# Patient Record
Sex: Female | Born: 1985 | Race: White | Hispanic: No | Marital: Married | Smoking: Current every day smoker
Health system: Southern US, Community
[De-identification: ages and names within clinical notes are randomized; demographics above are authoritative.]

## PROBLEM LIST (undated history)

## (undated) DIAGNOSIS — M5030 Other cervical disc degeneration, unspecified cervical region: Secondary | ICD-10-CM

## (undated) HISTORY — PX: CHOLECYSTECTOMY: SHX55

---

## 2018-07-21 ENCOUNTER — Emergency Department (HOSPITAL_COMMUNITY): Payer: Self-pay

## 2018-07-21 ENCOUNTER — Encounter (HOSPITAL_COMMUNITY): Payer: Self-pay | Admitting: Emergency Medicine

## 2018-07-21 ENCOUNTER — Emergency Department (HOSPITAL_COMMUNITY)
Admission: EM | Admit: 2018-07-21 | Discharge: 2018-07-21 | Disposition: A | Discharge status: Home / Self Care | Payer: Self-pay | Attending: Emergency Medicine | Admitting: Emergency Medicine

## 2018-07-21 ENCOUNTER — Other Ambulatory Visit: Payer: Self-pay

## 2018-07-21 DIAGNOSIS — S31119A Laceration without foreign body of abdominal wall, unspecified quadrant without penetration into peritoneal cavity, initial encounter: Secondary | ICD-10-CM

## 2018-07-21 DIAGNOSIS — S21219A Laceration without foreign body of unspecified back wall of thorax without penetration into thoracic cavity, initial encounter: Secondary | ICD-10-CM | POA: Insufficient documentation

## 2018-07-21 DIAGNOSIS — Y939 Activity, unspecified: Secondary | ICD-10-CM | POA: Insufficient documentation

## 2018-07-21 DIAGNOSIS — Z23 Encounter for immunization: Secondary | ICD-10-CM | POA: Insufficient documentation

## 2018-07-21 DIAGNOSIS — F17210 Nicotine dependence, cigarettes, uncomplicated: Secondary | ICD-10-CM | POA: Insufficient documentation

## 2018-07-21 DIAGNOSIS — S0922XA Traumatic rupture of left ear drum, initial encounter: Secondary | ICD-10-CM | POA: Insufficient documentation

## 2018-07-21 DIAGNOSIS — S0012XA Contusion of left eyelid and periocular area, initial encounter: Secondary | ICD-10-CM | POA: Insufficient documentation

## 2018-07-21 DIAGNOSIS — S31114A Laceration without foreign body of abdominal wall, left lower quadrant without penetration into peritoneal cavity, initial encounter: Secondary | ICD-10-CM | POA: Insufficient documentation

## 2018-07-21 DIAGNOSIS — M5030 Other cervical disc degeneration, unspecified cervical region: Secondary | ICD-10-CM | POA: Insufficient documentation

## 2018-07-21 DIAGNOSIS — Y999 Unspecified external cause status: Secondary | ICD-10-CM | POA: Insufficient documentation

## 2018-07-21 DIAGNOSIS — S0083XA Contusion of other part of head, initial encounter: Secondary | ICD-10-CM | POA: Insufficient documentation

## 2018-07-21 DIAGNOSIS — H7292 Unspecified perforation of tympanic membrane, left ear: Secondary | ICD-10-CM

## 2018-07-21 DIAGNOSIS — Y929 Unspecified place or not applicable: Secondary | ICD-10-CM | POA: Insufficient documentation

## 2018-07-21 HISTORY — DX: Other cervical disc degeneration, unspecified cervical region: M50.30

## 2018-07-21 LAB — I-STAT CREATININE, ED: Creatinine, Ser: 0.6 mg/dL (ref 0.44–1.00)

## 2018-07-21 LAB — I-STAT BETA HCG BLOOD, ED (MC, WL, AP ONLY): I-stat hCG, quantitative: <5 m[IU]/mL (ref <5)

## 2018-07-21 MED ORDER — OFLOXACIN 0.3 % OT SOLN: 5.0000 [drp] | Freq: Two times a day (BID) | OTIC | 0 refills | Status: AC

## 2018-07-21 MED ORDER — IBUPROFEN 800 MG PO TABS
800.0000 mg | ORAL_TABLET | Freq: Once | ORAL | Status: AC
  Administered 2018-07-21: 05:00:00 800 mg via ORAL
  Filled 2018-07-21: qty 2

## 2018-07-21 MED ORDER — IOHEXOL 300 MG/ML  SOLN
100.0000 mL | Freq: Once | INTRAMUSCULAR | Status: AC | PRN
  Administered 2018-07-21: 100 mL via INTRAVENOUS

## 2018-07-21 MED ORDER — TETANUS-DIPHTH-ACELL PERTUSSIS 5-2.5-18.5 LF-MCG/0.5 IM SUSP
0.5000 mL | Freq: Once | INTRAMUSCULAR | Status: AC
  Administered 2018-07-21: 02:00:00 0.5 mL via INTRAMUSCULAR
  Filled 2018-07-21: qty 0.5

## 2018-07-21 MED ORDER — LIDOCAINE-EPINEPHRINE (PF) 1 %-1:200000 IJ SOLN
20.0000 mL | Freq: Once | INTRAMUSCULAR | Status: AC
  Administered 2018-07-21: 20 mL

## 2018-07-21 MED ORDER — LIDOCAINE-EPINEPHRINE (PF) 1 %-1:200000 IJ SOLN
INTRAMUSCULAR | Status: AC
  Filled 2018-07-21: qty 30

## 2018-07-21 MED ORDER — ACETAMINOPHEN 500 MG PO TABS
1000.0000 mg | ORAL_TABLET | Freq: Once | ORAL | Status: AC
  Administered 2018-07-21: 1000 mg via ORAL
  Filled 2018-07-21: qty 2
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
## 2018-07-21 NOTE — Discharge Instructions (Addendum)
Your CT scans are negative for fracture or other serious injury.  Use Tylenol and ibuprofen as needed for pain and swelling use ice packs to your sore areas.  Follow-up for staple removal in 1 week.  Follow-up with the ear doctor regarding your ruptured eardrum.  Use the antibiotic drops as prescribed.  Return to the ED with new or worsening symptoms.
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
